# Patient Record
Sex: Male | Born: 1985 | Race: Black or African American | Hispanic: No | Marital: Single | Smoking: Never smoker
Health system: Southern US, Community
[De-identification: ages and names within clinical notes are randomized; demographics above are authoritative.]

## PROBLEM LIST (undated history)

## (undated) DIAGNOSIS — R011 Cardiac murmur, unspecified: Secondary | ICD-10-CM

## (undated) DIAGNOSIS — J45909 Unspecified asthma, uncomplicated: Secondary | ICD-10-CM

## (undated) HISTORY — PX: ROTATOR CUFF REPAIR: SHX139

---

## 2007-05-07 ENCOUNTER — Emergency Department (HOSPITAL_COMMUNITY): Admission: EM | Admit: 2007-05-07 | Discharge: 2007-05-07 | Payer: Self-pay | Admitting: Emergency Medicine

## 2007-09-09 ENCOUNTER — Emergency Department (HOSPITAL_COMMUNITY): Admission: EM | Admit: 2007-09-09 | Discharge: 2007-09-09 | Payer: Self-pay | Admitting: Emergency Medicine

## 2009-06-14 ENCOUNTER — Emergency Department (HOSPITAL_COMMUNITY): Admission: EM | Admit: 2009-06-14 | Discharge: 2009-06-14 | Payer: Self-pay | Admitting: Emergency Medicine

## 2009-08-09 ENCOUNTER — Ambulatory Visit (HOSPITAL_COMMUNITY): Admission: RE | Admit: 2009-08-09 | Discharge: 2009-08-09 | Payer: Self-pay | Admitting: Chiropractic Medicine

## 2009-09-22 ENCOUNTER — Encounter: Admission: RE | Admit: 2009-09-22 | Discharge: 2009-09-22 | Payer: Self-pay | Admitting: Orthopedic Surgery

## 2009-10-24 ENCOUNTER — Ambulatory Visit (HOSPITAL_COMMUNITY): Admission: RE | Admit: 2009-10-24 | Discharge: 2009-10-25 | Payer: Self-pay | Admitting: Orthopedic Surgery

## 2009-12-01 ENCOUNTER — Ambulatory Visit (HOSPITAL_COMMUNITY): Admission: RE | Admit: 2009-12-01 | Discharge: 2009-12-02 | Payer: Self-pay | Admitting: Orthopedic Surgery

## 2010-05-20 ENCOUNTER — Encounter: Payer: Self-pay | Admitting: Chiropractic Medicine

## 2010-07-14 LAB — SURGICAL PCR SCREEN: MRSA, PCR: NEGATIVE

## 2010-07-15 LAB — SURGICAL PCR SCREEN: MRSA, PCR: NEGATIVE

## 2013-01-05 ENCOUNTER — Emergency Department: Payer: Self-pay | Admitting: Emergency Medicine

## 2013-02-07 ENCOUNTER — Emergency Department (HOSPITAL_COMMUNITY)
Admission: EM | Admit: 2013-02-07 | Discharge: 2013-02-07 | Discharge status: Against Medical Advice | Payer: Self-pay | Attending: Emergency Medicine | Admitting: Emergency Medicine

## 2013-02-07 ENCOUNTER — Encounter (HOSPITAL_COMMUNITY): Payer: Self-pay | Admitting: Emergency Medicine

## 2013-02-07 DIAGNOSIS — S8990XA Unspecified injury of unspecified lower leg, initial encounter: Secondary | ICD-10-CM | POA: Insufficient documentation

## 2013-02-07 DIAGNOSIS — J45909 Unspecified asthma, uncomplicated: Secondary | ICD-10-CM | POA: Insufficient documentation

## 2013-02-07 DIAGNOSIS — Y939 Activity, unspecified: Secondary | ICD-10-CM | POA: Insufficient documentation

## 2013-02-07 DIAGNOSIS — W172XXA Fall into hole, initial encounter: Secondary | ICD-10-CM | POA: Insufficient documentation

## 2013-02-07 DIAGNOSIS — Y929 Unspecified place or not applicable: Secondary | ICD-10-CM | POA: Insufficient documentation

## 2013-02-07 HISTORY — DX: Unspecified asthma, uncomplicated: J45.909

## 2013-02-07 NOTE — ED Notes (Signed)
PA went to assess pt however pt was no longer in the room.  Pt did not notify staff he was leaving.

## 2013-02-07 NOTE — ED Notes (Signed)
Pt c/o L knee and ankle pain after stepping in pot hole Friday. Swelling noted to L knee. Steady gait

## 2013-02-07 NOTE — ED Notes (Signed)
Pt has still not returned to room.

## 2014-07-21 ENCOUNTER — Observation Stay (HOSPITAL_COMMUNITY)
Admission: EM | Admit: 2014-07-21 | Discharge: 2014-07-22 | Disposition: A | Discharge status: Home / Self Care | Payer: Worker's Compensation | Attending: Orthopedic Surgery | Admitting: Orthopedic Surgery

## 2014-07-21 ENCOUNTER — Encounter (HOSPITAL_COMMUNITY): Payer: Self-pay | Admitting: General Practice

## 2014-07-21 ENCOUNTER — Emergency Department (HOSPITAL_COMMUNITY): Payer: Worker's Compensation | Admitting: Anesthesiology

## 2014-07-21 ENCOUNTER — Encounter (HOSPITAL_COMMUNITY)
Admission: EM | Disposition: A | Discharge status: Home / Self Care | Payer: Self-pay | Source: Home / Self Care | Attending: Emergency Medicine

## 2014-07-21 ENCOUNTER — Emergency Department (HOSPITAL_COMMUNITY): Payer: Worker's Compensation

## 2014-07-21 DIAGNOSIS — W230XXA Caught, crushed, jammed, or pinched between moving objects, initial encounter: Secondary | ICD-10-CM | POA: Insufficient documentation

## 2014-07-21 DIAGNOSIS — J45909 Unspecified asthma, uncomplicated: Secondary | ICD-10-CM | POA: Diagnosis not present

## 2014-07-21 DIAGNOSIS — Y9289 Other specified places as the place of occurrence of the external cause: Secondary | ICD-10-CM | POA: Diagnosis not present

## 2014-07-21 DIAGNOSIS — S91319A Laceration without foreign body, unspecified foot, initial encounter: Secondary | ICD-10-CM | POA: Diagnosis present

## 2014-07-21 DIAGNOSIS — S91301A Unspecified open wound, right foot, initial encounter: Principal | ICD-10-CM | POA: Insufficient documentation

## 2014-07-21 HISTORY — DX: Cardiac murmur, unspecified: R01.1

## 2014-07-21 HISTORY — PX: I&D EXTREMITY: SHX5045

## 2014-07-21 LAB — RAPID URINE DRUG SCREEN, HOSP PERFORMED
Amphetamines: NOT DETECTED
Barbiturates: NOT DETECTED
Benzodiazepines: NOT DETECTED
Cocaine: NOT DETECTED
Opiates: NOT DETECTED
Tetrahydrocannabinol: POSITIVE — AB

## 2014-07-21 SURGERY — IRRIGATION AND DEBRIDEMENT EXTREMITY
Anesthesia: General | Site: Foot | Laterality: Right

## 2014-07-21 MED ORDER — KETOROLAC TROMETHAMINE 15 MG/ML IJ SOLN
15.0000 mg | Freq: Four times a day (QID) | INTRAMUSCULAR | Status: DC
  Administered 2014-07-22 (×2): 15 mg via INTRAVENOUS
  Filled 2014-07-21 (×2): qty 1

## 2014-07-21 MED ORDER — KETOROLAC TROMETHAMINE 30 MG/ML IJ SOLN
INTRAMUSCULAR | Status: AC
  Filled 2014-07-21: qty 1

## 2014-07-21 MED ORDER — FENTANYL CITRATE 0.05 MG/ML IJ SOLN
50.0000 ug | Freq: Once | INTRAMUSCULAR | Status: AC
  Administered 2014-07-21: 50 ug via INTRAVENOUS
  Filled 2014-07-21: qty 2

## 2014-07-21 MED ORDER — LIDOCAINE HCL 2 % IJ SOLN
15.0000 mL | Freq: Once | INTRAMUSCULAR | Status: DC
  Filled 2014-07-21 (×2): qty 20

## 2014-07-21 MED ORDER — SODIUM CHLORIDE 0.9 % IR SOLN
Status: DC | PRN
  Administered 2014-07-21: 3000 mL

## 2014-07-21 MED ORDER — HYDROCODONE-ACETAMINOPHEN 5-325 MG PO TABS
1.0000 | ORAL_TABLET | ORAL | Status: DC | PRN
  Administered 2014-07-21 – 2014-07-22 (×4): 2 via ORAL
  Filled 2014-07-21 (×4): qty 2

## 2014-07-21 MED ORDER — LIDOCAINE HCL (CARDIAC) 20 MG/ML IV SOLN
INTRAVENOUS | Status: AC
  Filled 2014-07-21: qty 5

## 2014-07-21 MED ORDER — MIDAZOLAM HCL 2 MG/2ML IJ SOLN
INTRAMUSCULAR | Status: AC
  Filled 2014-07-21: qty 2

## 2014-07-21 MED ORDER — PHENYLEPHRINE HCL 10 MG/ML IJ SOLN
INTRAMUSCULAR | Status: DC | PRN
  Administered 2014-07-21: 80 ug via INTRAVENOUS

## 2014-07-21 MED ORDER — BUPIVACAINE HCL 0.25 % IJ SOLN: 15.0000 mL | Freq: Once | INTRAMUSCULAR | Status: DC

## 2014-07-21 MED ORDER — SENNA 8.6 MG PO TABS
1.0000 | ORAL_TABLET | Freq: Two times a day (BID) | ORAL | Status: DC
  Administered 2014-07-21 – 2014-07-22 (×2): 8.6 mg via ORAL
  Filled 2014-07-21 (×2): qty 1

## 2014-07-21 MED ORDER — FENTANYL CITRATE 0.05 MG/ML IJ SOLN
INTRAMUSCULAR | Status: DC | PRN
  Administered 2014-07-21: 100 ug via INTRAVENOUS
  Administered 2014-07-21: 50 ug via INTRAVENOUS

## 2014-07-21 MED ORDER — PROPOFOL 10 MG/ML IV BOLUS
INTRAVENOUS | Status: AC
  Filled 2014-07-21: qty 20

## 2014-07-21 MED ORDER — TETANUS-DIPHTH-ACELL PERTUSSIS 5-2.5-18.5 LF-MCG/0.5 IM SUSP
0.5000 mL | Freq: Once | INTRAMUSCULAR | Status: AC
Start: 2014-07-21 — End: 2014-07-21
  Administered 2014-07-21: 0.5 mL via INTRAMUSCULAR
  Filled 2014-07-21: qty 0.5

## 2014-07-21 MED ORDER — OXYCODONE-ACETAMINOPHEN 5-325 MG PO TABS: 2.0000 | ORAL_TABLET | Freq: Once | ORAL | Status: DC

## 2014-07-21 MED ORDER — MORPHINE SULFATE 2 MG/ML IJ SOLN
2.0000 mg | INTRAMUSCULAR | Status: DC | PRN
  Administered 2014-07-21 – 2014-07-22 (×5): 2 mg via INTRAVENOUS
  Filled 2014-07-21 (×5): qty 1

## 2014-07-21 MED ORDER — FENTANYL CITRATE 0.05 MG/ML IJ SOLN
25.0000 ug | INTRAMUSCULAR | Status: DC | PRN
  Administered 2014-07-21: 50 ug via INTRAVENOUS

## 2014-07-21 MED ORDER — ONDANSETRON HCL 4 MG PO TABS: 4.0000 mg | ORAL_TABLET | Freq: Four times a day (QID) | ORAL | Status: DC | PRN

## 2014-07-21 MED ORDER — CEFAZOLIN SODIUM-DEXTROSE 2-3 GM-% IV SOLR
2.0000 g | Freq: Four times a day (QID) | INTRAVENOUS | Status: AC
  Administered 2014-07-21 – 2014-07-22 (×3): 2 g via INTRAVENOUS
  Filled 2014-07-21 (×3): qty 50

## 2014-07-21 MED ORDER — PROPOFOL 10 MG/ML IV BOLUS
INTRAVENOUS | Status: DC | PRN
  Administered 2014-07-21: 200 mg via INTRAVENOUS

## 2014-07-21 MED ORDER — KETOROLAC TROMETHAMINE 30 MG/ML IJ SOLN
30.0000 mg | Freq: Once | INTRAMUSCULAR | Status: AC | PRN
  Administered 2014-07-21: 30 mg via INTRAVENOUS

## 2014-07-21 MED ORDER — LIDOCAINE HCL (CARDIAC) 20 MG/ML IV SOLN
INTRAVENOUS | Status: DC | PRN
  Administered 2014-07-21: 80 mg via INTRAVENOUS

## 2014-07-21 MED ORDER — CEFAZOLIN SODIUM-DEXTROSE 2-3 GM-% IV SOLR
INTRAVENOUS | Status: DC | PRN
  Administered 2014-07-21: 2 g via INTRAVENOUS

## 2014-07-21 MED ORDER — DOCUSATE SODIUM 100 MG PO CAPS
100.0000 mg | ORAL_CAPSULE | Freq: Two times a day (BID) | ORAL | Status: DC
  Administered 2014-07-21 – 2014-07-22 (×2): 100 mg via ORAL
  Filled 2014-07-21 (×2): qty 1

## 2014-07-21 MED ORDER — LIDOCAINE-EPINEPHRINE 1 %-1:100000 IJ SOLN
20.0000 mL | Freq: Once | INTRAMUSCULAR | Status: AC
  Administered 2014-07-21: 20 mL
  Filled 2014-07-21 (×2): qty 1

## 2014-07-21 MED ORDER — BUPIVACAINE HCL 0.25 % IJ SOLN
15.0000 mL | Freq: Once | INTRAMUSCULAR | Status: DC
  Filled 2014-07-21: qty 15

## 2014-07-21 MED ORDER — SODIUM CHLORIDE 0.9 % IR SOLN
Status: DC | PRN
  Administered 2014-07-21: 1000 mL

## 2014-07-21 MED ORDER — BUPIVACAINE HCL (PF) 0.25 % IJ SOLN
15.0000 mL | Freq: Once | INTRAMUSCULAR | Status: DC
  Filled 2014-07-21: qty 20

## 2014-07-21 MED ORDER — LACTATED RINGERS IV SOLN
INTRAVENOUS | Status: DC
  Administered 2014-07-21: 18:00:00 via INTRAVENOUS

## 2014-07-21 MED ORDER — SUCCINYLCHOLINE CHLORIDE 20 MG/ML IJ SOLN
INTRAMUSCULAR | Status: DC | PRN
  Administered 2014-07-21: 140 mg via INTRAVENOUS

## 2014-07-21 MED ORDER — PROMETHAZINE HCL 25 MG/ML IJ SOLN: 6.2500 mg | INTRAMUSCULAR | Status: DC | PRN

## 2014-07-21 MED ORDER — ONDANSETRON HCL 4 MG/2ML IJ SOLN: 4.0000 mg | Freq: Four times a day (QID) | INTRAMUSCULAR | Status: DC | PRN

## 2014-07-21 MED ORDER — METOCLOPRAMIDE HCL 5 MG/ML IJ SOLN: 5.0000 mg | Freq: Three times a day (TID) | INTRAMUSCULAR | Status: DC | PRN

## 2014-07-21 MED ORDER — FENTANYL CITRATE 0.05 MG/ML IJ SOLN
INTRAMUSCULAR | Status: AC
  Filled 2014-07-21: qty 5

## 2014-07-21 MED ORDER — ACETAMINOPHEN 325 MG PO TABS: 650.0000 mg | ORAL_TABLET | Freq: Four times a day (QID) | ORAL | Status: DC | PRN

## 2014-07-21 MED ORDER — SODIUM CHLORIDE 0.9 % IV SOLN: INTRAVENOUS | Status: DC

## 2014-07-21 MED ORDER — CEFAZOLIN SODIUM 1-5 GM-% IV SOLN
1.0000 g | Freq: Once | INTRAVENOUS | Status: AC
  Administered 2014-07-21: 1 g via INTRAVENOUS
  Filled 2014-07-21: qty 50

## 2014-07-21 MED ORDER — MIDAZOLAM HCL 5 MG/5ML IJ SOLN
INTRAMUSCULAR | Status: DC | PRN
Start: 2014-07-21 — End: 2014-07-21
  Administered 2014-07-21: 2 mg via INTRAVENOUS

## 2014-07-21 MED ORDER — FENTANYL CITRATE 0.05 MG/ML IJ SOLN
INTRAMUSCULAR | Status: AC
  Filled 2014-07-21: qty 2

## 2014-07-21 MED ORDER — ONDANSETRON HCL 4 MG/2ML IJ SOLN
INTRAMUSCULAR | Status: DC | PRN
  Administered 2014-07-21: 4 mg via INTRAVENOUS

## 2014-07-21 MED ORDER — METOCLOPRAMIDE HCL 5 MG PO TABS: 5.0000 mg | ORAL_TABLET | Freq: Three times a day (TID) | ORAL | Status: DC | PRN

## 2014-07-21 MED ORDER — ACETAMINOPHEN 650 MG RE SUPP: 650.0000 mg | Freq: Four times a day (QID) | RECTAL | Status: DC | PRN

## 2014-07-21 SURGICAL SUPPLY — 74 items
BANDAGE ELASTIC 3 VELCRO ST LF (GAUZE/BANDAGES/DRESSINGS) IMPLANT
BANDAGE ELASTIC 4 VELCRO ST LF (GAUZE/BANDAGES/DRESSINGS) ×3 IMPLANT
BANDAGE ELASTIC 6 VELCRO ST LF (GAUZE/BANDAGES/DRESSINGS) ×3 IMPLANT
BLADE SURG 10 STRL SS (BLADE) ×3 IMPLANT
BNDG COHESIVE 1X5 TAN STRL LF (GAUZE/BANDAGES/DRESSINGS) IMPLANT
BNDG COHESIVE 4X5 TAN STRL (GAUZE/BANDAGES/DRESSINGS) ×3 IMPLANT
BNDG COHESIVE 6X5 TAN STRL LF (GAUZE/BANDAGES/DRESSINGS) ×6 IMPLANT
BNDG CONFORM 3 STRL LF (GAUZE/BANDAGES/DRESSINGS) IMPLANT
BNDG GAUZE STRTCH 6 (GAUZE/BANDAGES/DRESSINGS) ×9 IMPLANT
CORDS BIPOLAR (ELECTRODE) IMPLANT
COVER SURGICAL LIGHT HANDLE (MISCELLANEOUS) ×3 IMPLANT
CUFF TOURNIQUET SINGLE 24IN (TOURNIQUET CUFF) IMPLANT
CUFF TOURNIQUET SINGLE 34IN LL (TOURNIQUET CUFF) ×6 IMPLANT
CUFF TOURNIQUET SINGLE 44IN (TOURNIQUET CUFF) IMPLANT
DRAPE EXTREMITY BILATERAL (DRAPE) IMPLANT
DRAPE IMP U-DRAPE 54X76 (DRAPES) IMPLANT
DRAPE INCISE IOBAN 66X45 STRL (DRAPES) ×12 IMPLANT
DRAPE SURG 17X23 STRL (DRAPES) IMPLANT
DRAPE U-SHAPE 47X51 STRL (DRAPES) ×3 IMPLANT
DRSG PAD ABDOMINAL 8X10 ST (GAUZE/BANDAGES/DRESSINGS) ×3 IMPLANT
DURAPREP 26ML APPLICATOR (WOUND CARE) ×3 IMPLANT
ELECT CAUTERY BLADE 6.4 (BLADE) ×3 IMPLANT
ELECT REM PT RETURN 9FT ADLT (ELECTROSURGICAL)
ELECTRODE REM PT RTRN 9FT ADLT (ELECTROSURGICAL) IMPLANT
FACESHIELD WRAPAROUND (MASK) IMPLANT
GAUZE SPONGE 4X4 12PLY STRL (GAUZE/BANDAGES/DRESSINGS) ×6 IMPLANT
GAUZE XEROFORM 1X8 LF (GAUZE/BANDAGES/DRESSINGS) ×3 IMPLANT
GAUZE XEROFORM 5X9 LF (GAUZE/BANDAGES/DRESSINGS) ×3 IMPLANT
GLOVE BIOGEL PI IND STRL 6.5 (GLOVE) ×1 IMPLANT
GLOVE BIOGEL PI IND STRL 8.5 (GLOVE) ×1 IMPLANT
GLOVE BIOGEL PI INDICATOR 6.5 (GLOVE) ×2
GLOVE BIOGEL PI INDICATOR 8.5 (GLOVE) ×2
GLOVE NEODERM STRL 7.5 LF PF (GLOVE) ×2 IMPLANT
GLOVE SURG NEODERM 7.5  LF PF (GLOVE) ×4
GOWN STRL REIN XL XLG (GOWN DISPOSABLE) ×6 IMPLANT
HANDPIECE INTERPULSE COAX TIP (DISPOSABLE)
KIT BASIN OR (CUSTOM PROCEDURE TRAY) ×3 IMPLANT
KIT ROOM TURNOVER OR (KITS) ×3 IMPLANT
MANIFOLD NEPTUNE II (INSTRUMENTS) ×3 IMPLANT
NEEDLE 22X1 1/2 (OR ONLY) (NEEDLE) ×3 IMPLANT
NS IRRIG 1000ML POUR BTL (IV SOLUTION) ×6 IMPLANT
PACK ORTHO EXTREMITY (CUSTOM PROCEDURE TRAY) ×3 IMPLANT
PAD ABD 8X10 STRL (GAUZE/BANDAGES/DRESSINGS) ×3 IMPLANT
PAD ARMBOARD 7.5X6 YLW CONV (MISCELLANEOUS) ×6 IMPLANT
PAD CAST 4YDX4 CTTN HI CHSV (CAST SUPPLIES) ×1 IMPLANT
PADDING CAST ABS 4INX4YD NS (CAST SUPPLIES) ×4
PADDING CAST ABS COTTON 4X4 ST (CAST SUPPLIES) ×2 IMPLANT
PADDING CAST COTTON 4X4 STRL (CAST SUPPLIES) ×2
PADDING CAST COTTON 6X4 STRL (CAST SUPPLIES) ×3 IMPLANT
POSITIONER HEAD PRONE TRACH (MISCELLANEOUS) ×3 IMPLANT
SET CYSTO W/LG BORE CLAMP LF (SET/KITS/TRAYS/PACK) ×3 IMPLANT
SET HNDPC FAN SPRY TIP SCT (DISPOSABLE) IMPLANT
SPLINT PLASTER CAST XFAST 5X30 (CAST SUPPLIES) ×1 IMPLANT
SPLINT PLASTER XFAST SET 5X30 (CAST SUPPLIES) ×2
SPONGE GAUZE 4X4 12PLY STER LF (GAUZE/BANDAGES/DRESSINGS) ×3 IMPLANT
SPONGE LAP 18X18 X RAY DECT (DISPOSABLE) ×6 IMPLANT
STOCKINETTE IMPERVIOUS 9X36 MD (GAUZE/BANDAGES/DRESSINGS) ×3 IMPLANT
SUT ETHILON 2 0 FS 18 (SUTURE) ×9 IMPLANT
SUT ETHILON 2 0 PSLX (SUTURE) ×3 IMPLANT
SUT ETHILON 3 0 PS 1 (SUTURE) ×6 IMPLANT
SUT ETHILON 4 0 PS 2 18 (SUTURE) ×15 IMPLANT
SUT VIC AB 2-0 CT1 36 (SUTURE) ×3 IMPLANT
SUT VIC AB 2-0 FS1 27 (SUTURE) ×6 IMPLANT
SYR CONTROL 10ML LL (SYRINGE) IMPLANT
TOWEL OR 17X24 6PK STRL BLUE (TOWEL DISPOSABLE) ×3 IMPLANT
TOWEL OR 17X26 10 PK STRL BLUE (TOWEL DISPOSABLE) ×3 IMPLANT
TUBE ANAEROBIC SPECIMEN COL (MISCELLANEOUS) IMPLANT
TUBE CONNECTING 12'X1/4 (SUCTIONS) ×1
TUBE CONNECTING 12X1/4 (SUCTIONS) ×2 IMPLANT
TUBE FEEDING 5FR 15 INCH (TUBING) IMPLANT
TUBING CYSTO DISP (UROLOGICAL SUPPLIES) ×3 IMPLANT
UNDERPAD 30X30 INCONTINENT (UNDERPADS AND DIAPERS) ×6 IMPLANT
WATER STERILE IRR 1000ML POUR (IV SOLUTION) ×3 IMPLANT
YANKAUER SUCT BULB TIP NO VENT (SUCTIONS) ×3 IMPLANT

## 2014-07-21 NOTE — ED Provider Notes (Signed)
CSN: [id number]     Arrival date & time 07/21/14  1033 History   First MD Initiated Contact with Patient 07/21/14 1039     Chief Complaint  Patient presents with  . Foot Injury    HPI   29 year old male presents with avulsion injury to his right calcaneus. Patient reports that he was at work when his foot got stuck between a cart and a rail. He reports immediate pain difficulty with ambulation. Patient reports full range of motion of his ankle with pain to the calcaneus. Denies loss of distal sensation, motor function, perfusion. No other injuries noted, no knee pain, patient reports no prior injuries to that extremity. He has no other complaints in addition to the pain.   Past Medical History  Diagnosis Date  . Asthma   . Heart murmur    Past Surgical History  Procedure Laterality Date  . Rotator cuff repair     No family history on file. History  Substance Use Topics  . Smoking status: Never Smoker   . Smokeless tobacco: Not on file  . Alcohol Use: Yes     Comment: socially    Review of Systems  All other systems reviewed and are negative.  Allergies  Review of patient's allergies indicates no known allergies.  Home Medications   Prior to Admission medications   Medication Sig Start Date End Date Taking? Authorizing Provider  ibuprofen (ADVIL,MOTRIN) 200 MG tablet Take 800 mg by mouth every 6 (six) hours as needed for pain.    Historical Provider, MD  naproxen sodium (ANAPROX) 220 MG tablet Take 220 mg by mouth 2 (two) times daily with a meal.    Historical Provider, MD   BP 120/63 mmHg  Pulse 59  Temp(Src) 97.6 F (36.4 C) (Oral)  Resp 12  Ht 5\' 11"  (1.803 m)  Wt 205 lb (92.987 kg)  BMI 28.60 kg/m2  SpO2 100% Physical Exam  Constitutional: He is oriented to person, place, and time. He appears well-developed and well-nourished.  HENT:  Head: Normocephalic and atraumatic.  Eyes: Pupils are equal, round, and reactive to light.  Neck: Normal range of motion.  Neck supple. No JVD present. No tracheal deviation present. No thyromegaly present.  Cardiovascular: Normal rate, regular rhythm, normal heart sounds and intact distal pulses.  Exam reveals no gallop and no friction rub.   No murmur heard. Pulmonary/Chest: Effort normal and breath sounds normal. No stridor. No respiratory distress. He has no wheezes. He has no rales. He exhibits no tenderness.  Musculoskeletal: Normal range of motion.  Elliptical avulsion injury to the right calcaneus approximately 5 cm long by 3 cm, periosteum visualized no foreign bodies. Distal sensation and strength or motor function intact. No sign of tendon injury.  Lymphadenopathy:    He has no cervical adenopathy.  Neurological: He is alert and oriented to person, place, and time. Coordination normal.  Skin: Skin is warm and dry.  Psychiatric: He has a normal mood and affect. His behavior is normal. Judgment and thought content normal.  Nursing note and vitals reviewed.   ED Course  Procedures (including critical care time) Labs Review Labs Reviewed - No data to display  Imaging Review No results found.   EKG Interpretation None     MDM   Final diagnoses:  Avulsion of skin of foot, right, initial encounter    Patient's x-ray showed no signs of fracture. Due to exposed wound patient received 1 g Ancef, and was given multiple doses of fentanyl.  The wound was irrigated with normal saline and covered with a wet-to-dry dressing. Orthopedics, plastics, surgery was consulted.  Brain Swinteck M.D. with [facility] was consult at and agreed to take care for patient. Patient was transferred to the operating room. Stable throughout stay. Patient did not receive tetanus, operating room staff was notified and assured that he'll be receiving it.  Additionally patient's employer called requiring drug screening, [employer] Holstein was the representative.    Eyvonne Mechanic, PA-C 07/21/14 1745  Rolan Bucco,  MD 07/22/14 [phone]

## 2014-07-21 NOTE — Anesthesia Postprocedure Evaluation (Signed)
  Anesthesia Post-op Note  Patient: Cristian Castillo  Procedure(s) Performed: Procedure(s): IRRIGATION AND DEBRIDEMENT SOFT TISSUE AVULSION RIGHT HEEL (Right)  Patient Location: PACU  Anesthesia Type:General  Level of Consciousness: awake, alert , oriented and patient cooperative  Airway and Oxygen Therapy: Patient Spontanous Breathing and Patient connected to nasal cannula oxygen  Post-op Pain: none  Post-op Assessment: Post-op Vital signs reviewed, Patient's Cardiovascular Status Stable, Respiratory Function Stable, Patent Airway, No signs of Nausea or vomiting and Pain level controlled  Post-op Vital Signs: Reviewed and stable  Last Vitals:  Filed Vitals:   07/21/14 2041  BP: 135/63  Pulse: 51  Temp:   Resp: 13    Complications: No apparent anesthesia complications

## 2014-07-21 NOTE — ED Notes (Signed)
Pt brought in via [facility] from working on a [employer]. Pts right foot got stuck in between a trolley and a cart. Pt has a full thickness avulsion on heal. Pt rating pain a 10/10. EMS gave pt 150mcg of fentanyl. EMS V/S 126/78, HR 68, RR 16, SPO2 100%.

## 2014-07-21 NOTE — Anesthesia Procedure Notes (Addendum)
Procedure Name: LMA Insertion Date/Time: 07/21/2014 6:02 PM Performed by: Adonis HousekeeperNGELL, Lily Velasquez M Pre-anesthesia Checklist: Patient identified, Emergency Drugs available, Suction available and Patient being monitored Patient Re-evaluated:Patient Re-evaluated prior to inductionOxygen Delivery Method: Circle system utilized Preoxygenation: Pre-oxygenation with 100% oxygen Intubation Type: IV induction Ventilation: Mask ventilation without difficulty LMA: LMA inserted LMA Size: 5.0 Number of attempts: 1 Placement Confirmation: positive ETCO2 and breath sounds checked- equal and bilateral Tube secured with: Tape   Procedure Name: Intubation Date/Time: 07/21/2014 6:16 PM Performed by: Adonis HousekeeperNGELL, Beatrice Ziehm M Pre-anesthesia Checklist: Patient identified, Suction available, Emergency Drugs available and Patient being monitored Patient Re-evaluated:Patient Re-evaluated prior to inductionPreoxygenation: preoxygenated, pt already had inhaled anesthetics on board. Laryngoscope Size: Mac and 4 Grade View: Grade II Tube type: Oral Tube size: 7.5 mm Number of attempts: 1 Airway Equipment and Method: Stylet Placement Confirmation: ETT inserted through vocal cords under direct vision,  positive ETCO2 and breath sounds checked- equal and bilateral Secured at: 22 cm Tube secured with: Tape Dental Injury: Teeth and Oropharynx as per pre-operative assessment

## 2014-07-21 NOTE — Transfer of Care (Signed)
Immediate Anesthesia Transfer of Care Note 2 Patient: Cristian Castillo  Procedure(s) Performed: Procedure(s): IRRIGATION AND DEBRIDEMENT SOFT TISSUE AVULSION RIGHT HEEL (Right)  Patient Location: PACU  Anesthesia Type:General  Level of Consciousness: awake, alert , oriented and patient cooperative  Airway & Oxygen Therapy: Patient Spontanous Breathing and Patient connected to nasal cannula oxygen  Post-op Assessment: Report given to RN, Post -op Vital signs reviewed and stable, Patient moving all extremities and Patient moving all extremities X 4  Post vital signs: Reviewed and stable  Last Vitals:  Filed Vitals:   07/21/14 2041  BP: 135/63  Pulse: 51  Temp:   Resp: 13    Complications: No apparent anesthesia complications

## 2014-07-21 NOTE — Consult Note (Signed)
   ORTHOPAEDIC CONSULTATION  REQUESTING PHYSICIAN: No att. providers found Eyvonne MechanicJeffrey Hedges  PCP:  No PCP Per Patient  Chief Complaint: right foot injury  HPI: Cristian Castillo is a 29 y.o. male who complains of  Right foot injury. Was at work earlier today when his right foot got caught between a metal cart and a trolley. Has soft tissue avulsion over heel. Received tetanus. Denies other injuries.  Past Medical History  Diagnosis Date  . Asthma   . Heart murmur    Past Surgical History  Procedure Laterality Date  . Rotator cuff repair     History   Social History  . Marital Status: Single    Spouse Name: N/A  . Number of Children: N/A  . Years of Education: N/A   Social History Main Topics  . Smoking status: Never Smoker   . Smokeless tobacco: Not on file  . Alcohol Use: Yes     Comment: socially  . Drug Use: No  . Sexual Activity: Not on file   Other Topics Concern  . None   Social History Narrative   History reviewed. No pertinent family history. No Known Allergies Prior to Admission medications   Medication Sig Start Date End Date Taking? Authorizing Provider  ibuprofen (ADVIL,MOTRIN) 200 MG tablet Take 800 mg by mouth every 6 (six) hours as needed for headache or moderate pain.    Yes Historical Provider, MD  naproxen sodium (ANAPROX) 220 MG tablet Take 220 mg by mouth 2 (two) times daily with a meal.   Yes Historical Provider, MD  PRESCRIPTION MEDICATION Take 1 tablet by mouth daily as needed (muscle relaxer).   Yes Historical Provider, MD   Dg Foot Complete Right  07/21/2014   CLINICAL DATA:  29 year old male with crush injury to right foot. Heel avulsion. Initial encounter.  EXAM: RIGHT FOOT COMPLETE - 3+ VIEW  COMPARISON:  None.  FINDINGS: Severe soft tissue injury over the dorsal calcaneus, appears to extend to the periosteum. The calcaneus appears intact. Alignment about the right ankle appears preserved. Midfoot and distal foot osseous structures appear  intact and normally aligned. Healed appearing chronic fractures at the head of the third metatarsal and base of the fourth proximal phalanx. No acute fracture identified.  IMPRESSION: Severe soft tissue wound at the dorsal heel appears to extend to the surface of the calcaneus.  No calcaneal fracture, and no other acute fracture or dislocation identified in the right foot.   Electronically Signed   By: Odessa FlemingH  Hall M.D.   On: 07/21/2014 11:44    Positive ROS: All other systems have been reviewed and were otherwise negative with the exception of those mentioned in the HPI and as above.  Physical Exam: General: Alert, no acute distress Cardiovascular: No pedal edema Respiratory: No cyanosis, no use of accessory musculature GI: No organomegaly, abdomen is soft and non-tender Skin: No lesions in the area of chief complaint Neurologic: Sensation intact distally Psychiatric: Patient is competent for consent with normal mood and affect Lymphatic: No axillary or cervical lymphadenopathy  MUSCULOSKELETAL: R foot bandaged. 2+ DP. Wiggles toes. + TA/Gs/EHL. Normal thompson test.  Assessment: Soft tissue avulsion, complex wound, right foot  Plan: I discussed the situation with the patient and his mother. Needs operative debridement and closure. May not be able to close wound. May need wound VAC and additional coverage procedures. At risk for infection. They understand.   Rodriguez Aguinaldo, Cloyde ReamsBrian James, MD Cell [phone]    07/21/2014 5:56 PM

## 2014-07-21 NOTE — Brief Op Note (Signed)
07/21/2014  7:43 PM  PATIENT:  Estella Huskodney Stahnke  29 y.o. male  PRE-OPERATIVE DIAGNOSIS:  Soft tissue Avulsion to right heel  POST-OPERATIVE DIAGNOSIS:  Soft tissue Avulsion to right heel  PROCEDURE:  Procedure(s): IRRIGATION AND DEBRIDEMENT SOFT TISSUE AVULSION RIGHT HEEL (Right)  SURGEON:  Surgeon(s) and Role:    * Samson FredericBrian Ryan Ogborn, MD - Primary  PHYSICIAN ASSISTANT:   ASSISTANTS: none   ANESTHESIA:   general  EBL:     BLOOD ADMINISTERED:none  DRAINS: none   LOCAL MEDICATIONS USED:  NONE  SPECIMEN:  No Specimen  DISPOSITION OF SPECIMEN:  N/A  COUNTS:  YES  TOURNIQUET:    DICTATION: .Other Dictation: Dictation Number [id number]  PLAN OF CARE: Admit for overnight observation  PATIENT DISPOSITION:  PACU - hemodynamically stable.   Delay start of Pharmacological VTE agent (>24hrs) due to surgical blood loss or risk of bleeding: not applicable

## 2014-07-21 NOTE — Anesthesia Preprocedure Evaluation (Addendum)
Anesthesia Evaluation  Patient identified by MRN, date of birth, ID band Patient awake    Reviewed: Allergy & Precautions, NPO status , Patient's Chart, lab work & pertinent test results  Airway Mallampati: II  TM Distance: >3 FB Neck ROM: Full    Dental no notable dental hx.    Pulmonary asthma ,  breath sounds clear to auscultation  Pulmonary exam normal       Cardiovascular negative cardio ROS  Rhythm:Regular Rate:Normal     Neuro/Psych negative neurological ROS  negative psych ROS   GI/Hepatic negative GI ROS, Neg liver ROS,   Endo/Other  negative endocrine ROS  Renal/GU negative Renal ROS  negative genitourinary   Musculoskeletal negative musculoskeletal ROS (+)   Abdominal   Peds negative pediatric ROS (+)  Hematology negative hematology ROS (+)   Anesthesia Other Findings   Reproductive/Obstetrics negative OB ROS                             Anesthesia Physical Anesthesia Plan  ASA: II  Anesthesia Plan: General   Post-op Pain Management:    Induction: Intravenous  Airway Management Planned: LMA and Oral ETT  Additional Equipment:   Intra-op Plan:   Post-operative Plan: Extubation in OR  Informed Consent: I have reviewed the patients History and Physical, chart, labs and discussed the procedure including the risks, benefits and alternatives for the proposed anesthesia with the patient or authorized representative who has indicated his/her understanding and acceptance.   Dental advisory given  Plan Discussed with: CRNA and Surgeon  Anesthesia Plan Comments: (Npo overnight)       Anesthesia Quick Evaluation

## 2014-07-21 NOTE — ED Notes (Signed)
Wet to dry dressing applied to foot.

## 2014-07-22 MED ORDER — DOCUSATE SODIUM 100 MG PO CAPS: 100.0000 mg | ORAL_CAPSULE | Freq: Two times a day (BID) | ORAL | Status: DC

## 2014-07-22 MED ORDER — HYDROCODONE-ACETAMINOPHEN 5-325 MG PO TABS: 1.0000 | ORAL_TABLET | ORAL | Status: AC | PRN

## 2014-07-22 MED ORDER — AMOXICILLIN-POT CLAVULANATE 875-125 MG PO TABS: 1.0000 | ORAL_TABLET | Freq: Two times a day (BID) | ORAL | Status: AC

## 2014-07-22 NOTE — Progress Notes (Signed)
Orthopedic Tech Progress Note Patient Details:  Cristian HuskRodney Castillo 04/09/1986 [id number]  Patient ID: Cristian Castillo, male   DOB: 12/20/1985, 29 y.o.   MRN: [id number] Viewed order from doctor's order list  [name], [name] 07/22/2014, 2:27 PM

## 2014-07-22 NOTE — Op Note (Signed)
Cristian Castillo, KOMAN NO.:  [id number]  MEDICAL RECORD NO.:  [id number]  LOCATION:  [id number]                        FACILITY:  [facility]  PHYSICIAN:  [name], MD     DATE OF BIRTH:  01/01/1986  DATE OF PROCEDURE:  07/21/2014 DATE OF DISCHARGE:  07/21/2014                              OPERATIVE REPORT   SURGEON:  [name], MD  ASSISTANT:  None.  PREOPERATIVE DIAGNOSIS:  Soft tissue avulsion/complex laceration to right heel.  PROCEDURE:  Debridement of skin, subcutaneous tissue, and fascia (paratenon) of the right heel wound with primary closure of complex laceration, totaling 15 cm in length.  IMPLANTS:  None.  SPECIMENS:  None.  TUBES AND DRAINS:  None.  DISPOSITION:  Stable to PACU.  COMPLICATIONS:  None.  ANTIBIOTICS:  Ancef 2 g.  INDICATIONS:  The patient is a 29 year old male who was starting a new job today.  He got his right lower extremity caught between a trolley and a metal plate essentially which caught him on the back of the right heel.  He had immediate pain and bleeding.  He had a large laceration. He was brought to the emergency room where x-rays revealed no fracture or dislocation over the calcaneus.  He was given IV Ancef and tetanus immunization.  Orthopedic consultation was obtained.  Risks, benefits, and alternatives to debridement and wound closure were explained, and the patient elected to proceed.  He understands that he may require additional surgeries in the future.  He understands that his skin flap may die, requiring soft tissue coverage or flap in the future.  He wished to proceed.  DESCRIPTION OF PROCEDURE IN DETAIL:  I identified the patient in the holding area.  Surgical site was marked.  He was taken to the operating room and general anesthesia was induced.  He was flipped prone.  The right lower extremity was prepped and draped in a normal sterile surgical fashion.  Time-out was called, verifying the sign  and site of surgery.  He did have a nonsterile tourniquet to the right thigh, but we did not inflate it.  I began by examining his right lower extremity.  He had a complex wound over the posterior aspect of the calcaneus near the insertion site of the Achilles on the calcaneus.  He essentially had a large wound that was 4 cm long x 5.5 cm wide.  There were 2 laterally based skin flaps within this wound.  The proximal one measured 2 x 5 and the distal one measured 2 x 4.5.  The distal flap was of good quality. The proximal flap was ecchymotic, but appeared viable.  I began by using a rongeur to sharply debride the subcutaneous tissues of contaminated and devitalized tissue.  I used 15 blade to excisionally debride the jagged and nonviable skin edges.  I then turned my attention deep into the wound.  He did have abrasion over the paratenon.  The fibers of the Achilles tendon were intact.  I did debride the paratenon of any devitalized tissue.  His neurovascular bundle was not involved; this was located well medial to where his wound was.  After meticulous systematic debridement of skin down  to paratenon, I then irrigated with 3 L of saline using cysto tubing.  I then closed the complex laceration using 4- 0 interrupted nylon sutures.  The wound did close with undue tension.  I then applied a dressing with Xeroform, fluff, 4x4, ABDs, and a posterior splint.  Sponge, needle, and instrument counts were correct at the end of the case x2.  There were no complications.  I discussed operative events and findings with the patient's family. They understand that his wound healing is contingent upon him not getting infected and upon his skin flaps remaining viable.  He will be nonweightbearing with crutches.  He will ice and elevate.  We will give him antibiotics overnight and plan to discharge him in the morning.  He will follow up in the office in 2 weeks and will take off his splint and have a  look at the wound.  If the skin flaps are not viable, [facility] will [facility] and have the Plastics take a look at him.          ______________________________ Samson FredericBrian Akeem Heppler, MD     BS/MEDQ  D:  07/21/2014  T:  07/22/2014  Job:  [id number]

## 2014-07-22 NOTE — Discharge Instructions (Signed)
Keep splint clean and dry. Do not remove. Non weight bearing right foot with crutches. Elevate right foot.

## 2014-07-22 NOTE — Progress Notes (Signed)
   Subjective:  Patient reports pain as mild to moderate.  No c/o.  Objective:   VITALS:   Filed Vitals:   07/21/14 2045 07/21/14 2102 07/22/14 0033 07/22/14 0500  BP:  137/58 104/63 108/51  Pulse: 51 52 59 66  Temp: 97.9 F (36.6 C) 97.8 F (36.6 C) 98 F (36.7 C) 98.3 F (36.8 C)  TempSrc:      Resp: 9 14 16 16   Height:      Weight:      SpO2: 100% 100% 97% 100%    Neurovascular intact splint C/D/I.  No results found for: WBC, HGB, HCT, MCV, PLT BMET No results found for: NA, K, CL, CO2, GLUCOSE, BUN, CREATININE, CALCIUM, GFRNONAA, GFRAA   Assessment/Plan: 1 Day Post-Op   Active Problems:   Laceration of heel without complication   Advance diet Up with therapy Discharge home with home health Finish IV abx today, then DC home   Cristian Castillo, Cristian ReamsBrian Castillo 07/22/2014, 9:06 AM   Cristian FredericBrian Kyleena Scheirer, MD Cell [phone]

## 2014-07-22 NOTE — Discharge Summary (Signed)
Physician Discharge Summary  Patient ID: Cristian Castillo MRN: [id number] DOB/AGE: Sep 08, 1985 29 y.o.  Admit date: 07-27-14 Discharge date: 07/22/2014  Admission Diagnoses:  Complex right foot laceration  Discharge Diagnoses:  Complex right foot laceration  Past Medical History  Diagnosis Date  . Asthma   . Heart murmur     Surgeries: Procedure(s): IRRIGATION AND DEBRIDEMENT SOFT TISSUE AVULSION RIGHT HEEL on 07/27/14   Consultants (if any):    Discharged Condition: Improved  Hospital Course: Cristian Castillo is an 29 y.o. male who was admitted 2014-07-27 with a diagnosis of Laceration of heel without complication and went to the operating room on 27-Jul-2014 and underwent the above named procedures.    He was given perioperative antibiotics:  Anti-infectives    Start     Dose/Rate Route Frequency Ordered Stop   07/22/14 0000  ceFAZolin (ANCEF) IVPB 2 g/50 mL premix     2 g 100 mL/hr over 30 Minutes Intravenous Every 6 hours Jul 27, 2014 2117 07/22/14 1759   07/22/14 0000  amoxicillin-clavulanate (AUGMENTIN) 875-125 MG per tablet     1 tablet Oral 2 times daily 07/22/14 0910     07/27/14 1100  ceFAZolin (ANCEF) IVPB 1 g/50 mL premix     1 g 100 mL/hr over 30 Minutes Intravenous  Once 07/27/2014 1053 27-Jul-2014 1200    .  He was given sequential compression devices, early ambulation for DVT prophylaxis.  He benefited maximally from the hospital stay and there were no complications.    Recent vital signs:  Filed Vitals:   07/22/14 0500  BP: 108/51  Pulse: 66  Temp: 98.3 F (36.8 C)  Resp: 16    Recent laboratory studies:  No results found for: HGB No results found for: WBC, PLT No results found for: INR No results found for: NA, K, CL, CO2, BUN, CREATININE, GLUCOSE  Discharge Medications:     Medication List    STOP taking these medications        naproxen sodium 220 MG tablet  Commonly known as:  ANAPROX      TAKE these medications        amoxicillin-clavulanate 875-125 MG per tablet  Commonly known as:  AUGMENTIN  Take 1 tablet by mouth 2 (two) times daily.     docusate sodium 100 MG capsule  Commonly known as:  COLACE  Take 1 capsule (100 mg total) by mouth 2 (two) times daily.     HYDROcodone-acetaminophen 5-325 MG per tablet  Commonly known as:  NORCO/VICODIN  Take 1-2 tablets by mouth every 4 (four) hours as needed (breakthrough pain).     ibuprofen 200 MG tablet  Commonly known as:  ADVIL,MOTRIN  Take 800 mg by mouth every 6 (six) hours as needed for headache or moderate pain.     PRESCRIPTION MEDICATION  Take 1 tablet by mouth daily as needed (muscle relaxer).        Diagnostic Studies: Dg Foot Complete Right  2014-07-27   CLINICAL DATA:  29 year old male with crush injury to right foot. Heel avulsion. Initial encounter.  EXAM: RIGHT FOOT COMPLETE - 3+ VIEW  COMPARISON:  None.  FINDINGS: Severe soft tissue injury over the dorsal calcaneus, appears to extend to the periosteum. The calcaneus appears intact. Alignment about the right ankle appears preserved. Midfoot and distal foot osseous structures appear intact and normally aligned. Healed appearing chronic fractures at the head of the third metatarsal and base of the fourth proximal phalanx. No acute fracture identified.  IMPRESSION: Severe soft tissue  wound at the dorsal heel appears to extend to the surface of the calcaneus.  No calcaneal fracture, and no other acute fracture or dislocation identified in the right foot.   Electronically Signed   By: Odessa FlemingH  Hall M.D.   On: 07/21/2014 11:44    Disposition: 07-Left Against Medical Advice      Discharge Instructions    Call MD / Call 911    Complete by:  As directed   If you experience chest pain or shortness of breath, CALL 911 and be transported to the hospital emergency room.  If you develope a fever above 101 F, pus (white drainage) or increased drainage or redness at the wound, or calf pain, call your surgeon's  office.     Constipation Prevention    Complete by:  As directed   Drink plenty of fluids.  Prune juice may be helpful.  You may use a stool softener, such as Colace (over the counter) 100 mg twice a day.  Use MiraLax (over the counter) for constipation as needed.     Diet - low sodium heart healthy    Complete by:  As directed      Discharge instructions    Complete by:  As directed   Keep splint clean and dry. Do not remove Elevate right foot     Increase activity slowly as tolerated    Complete by:  As directed            Follow-up Information    Follow up with Ellen Mayol, Cloyde ReamsBrian James, MD. Schedule an appointment as soon as possible for a visit in 2 weeks.   Specialty:  Orthopedic Surgery   Why:  For wound re-check   Contact information:   [address] [place] [id number] [phone]        Signed: Garnet KoyanagiSwinteck, Lynann Demetrius James 07/22/2014, 9:12 AM

## 2014-07-22 NOTE — Progress Notes (Signed)
Orthopedic Tech Progress Note Patient Details:  Estella HuskRodney Warsame 10/04/1985 [id number]  Ortho Devices Type of Ortho Device: Crutches Ortho Device/Splint Interventions: Application   Nikki Domrawford, Faizon Capozzi 07/22/2014, 2:27 PM

## 2014-07-22 NOTE — Progress Notes (Signed)
UR completed 

## 2014-07-22 NOTE — Evaluation (Signed)
Physical Therapy Evaluation Patient Details Name: Cristian Castillo MRN: [id number] DOB: [birth year] Today's Date: 07/22/2014   History of Present Illness  Pt is a 29 y/o M s/p I&D soft tissue avulsion R heel on 09/21/14.  Pt's PMH includes heart murmur and asthma.  Clinical Impression  Patient is s/p above procedure resulting in functional limitations due to the deficits listed below (see PT Problem List). Pt demonstrated ability to ambulate using RW 225 ft and crutches 25 ft w/ v/c's to slow down when turning to avoid LOB.  Pt requests crutches to use upon d/c.  Patient will benefit from skilled PT to increase his independence and safety with mobility to allow discharge to the venue listed below.       Follow Up Recommendations Home health PT;Supervision - Intermittent (Pt requests HHPT)    Equipment Recommendations  Crutches    Recommendations for Other Services       Precautions / Restrictions Precautions Precautions: Fall Restrictions Weight Bearing Restrictions: Yes RLE Weight Bearing: Non weight bearing      Mobility  Bed Mobility Overal bed mobility: Modified Independent             General bed mobility comments: use of rails and increased time.   Transfers Overall transfer level: Needs assistance Equipment used: Rolling walker (2 wheeled);Crutches Transfers: Sit to/from Stand Sit to Stand: Min guard         General transfer comment: Pt performs sit<>stand quicly and PT provided v/c's to slow down transition so as to maintain balance  Ambulation/Gait Ambulation/Gait assistance: Min guard Ambulation Distance (Feet): 250 Feet Assistive device: Crutches;Rolling walker (2 wheeled) Gait Pattern/deviations: Antalgic (hop on LLE)   Gait velocity interpretation: Below normal speed for age/gender General Gait Details: v/c's to maintain RW close to body when ambulating.  V/c's to take turns slowly as pt had slight LOB 2/2 turning too quickly w/ RW and crutches.  Pt  prefers crutches aside from the LOB during the turn he had no issues using them.  Stairs            Wheelchair Mobility    Modified Rankin (Stroke Patients Only)       Balance Overall balance assessment: Needs assistance Sitting-balance support: No upper extremity supported;Feet supported (LLE supported) Sitting balance-Leahy Scale: Good     Standing balance support: Bilateral upper extremity supported Standing balance-Leahy Scale: Fair Standing balance comment: Pt moves quickly during transitions/transfers which caused him to loose his balance x1 but pt was able to stabilize himself using his RW.                             Pertinent Vitals/Pain Pain Assessment: 0-10 Pain Score: 8  Pain Location: R ankle Pain Descriptors / Indicators: Aching;Throbbing;Heaviness Pain Intervention(s): Limited activity within patient's tolerance;Monitored during session;Repositioned    Home Living Family/patient expects to be discharged to:: Private residence Living Arrangements: Spouse/significant other (Fiance: Tiffany) Available Help at Discharge: Available PRN/intermittently (Fiance available 24/7 this weekend, then intermittently) Type of Home: Apartment Home Access: Level entry     Home Layout: One level;Able to live on main level with bedroom/bathroom Home Equipment: None      Prior Function Level of Independence: Independent               Hand Dominance   Dominant Hand: Right    Extremity/Trunk Assessment               Lower Extremity  Assessment: RLE deficits/detail RLE Deficits / Details: as expected s/p R heel laceration    Cervical / Trunk Assessment: Normal  Communication   Communication: No difficulties  Cognition Arousal/Alertness: Awake/alert Behavior During Therapy: WFL for tasks assessed/performed Overall Cognitive Status: Within Functional Limits for tasks assessed                      General Comments General  comments (skin integrity, edema, etc.): Pt requires v/c's to slow down when turning and during sit<>stand.      Exercises Total Joint Exercises Ankle Circles/Pumps: AROM;Both;10 reps;Supine Quad Sets: AROM;Right;5 reps;Supine Straight Leg Raises: AROM;Right;10 reps;Supine      Assessment/Plan    PT Assessment Patient needs continued PT services  PT Diagnosis Difficulty walking;Abnormality of gait;Generalized weakness;Acute pain   PT Problem List Decreased strength;Decreased range of motion;Decreased activity tolerance;Decreased balance;Decreased mobility;Decreased coordination;Decreased knowledge of use of DME;Decreased safety awareness;Decreased knowledge of precautions;Pain  PT Treatment Interventions DME instruction;Gait training;Stair training;Functional mobility training;Therapeutic activities;Therapeutic exercise;Balance training;Neuromuscular re-education;Patient/family education;Modalities   PT Goals (Current goals can be found in the Care Plan section) Acute Rehab PT Goals Patient Stated Goal: to go home PT Goal Formulation: With patient Time For Goal Achievement: 08/05/14 Potential to Achieve Goals: Good    Frequency Min 5X/week   Barriers to discharge Decreased caregiver support Fiance available for assist only intermittently    Co-evaluation               End of Session Equipment Utilized During Treatment: Gait belt Activity Tolerance: Patient tolerated treatment well Patient left: in chair;with call bell/phone within reach Nurse Communication: Mobility status;Precautions;Weight bearing status (Pt will need crutches)    Functional Assessment Tool Used: clinical judgement Functional Limitation: Mobility: Walking and moving around Mobility: Walking and Moving Around Current Status ([id number]): At least 20 percent but less than 40 percent impaired, limited or restricted Mobility: Walking and Moving Around Goal Status [phone]): At least 1 percent but less than 20  percent impaired, limited or restricted    Time: 1016-1045 PT Time Calculation (min) (ACUTE ONLY): 29 min   Charges:   PT Evaluation $Initial PT Evaluation Tier I: 1 Procedure PT Treatments $Gait Training: 8-22 mins   PT G Codes:   PT G-Codes **NOT FOR INPATIENT CLASS** Functional Assessment Tool Used: clinical judgement Functional Limitation: Mobility: Walking and moving around Mobility: Walking and Moving Around Current Status ([id number](G8978): At least 20 percent but less than 40 percent impaired, limited or restricted Mobility: Walking and Moving Around Goal Status [phone](G8979): At least 1 percent but less than 20 percent impaired, limited or restricted    Michail JewelsAshley Parr PT, DPT [phone] 07/22/2014, 11:52 AM

## 2014-07-25 ENCOUNTER — Encounter (HOSPITAL_COMMUNITY): Payer: Self-pay | Admitting: Orthopedic Surgery

## 2015-10-16 ENCOUNTER — Encounter: Payer: Self-pay | Admitting: Emergency Medicine

## 2015-10-16 ENCOUNTER — Emergency Department
Admission: EM | Admit: 2015-10-16 | Discharge: 2015-10-16 | Disposition: A | Discharge status: Home / Self Care | Payer: Worker's Compensation | Attending: Emergency Medicine | Admitting: Emergency Medicine

## 2015-10-16 DIAGNOSIS — Y9389 Activity, other specified: Secondary | ICD-10-CM | POA: Diagnosis not present

## 2015-10-16 DIAGNOSIS — J45909 Unspecified asthma, uncomplicated: Secondary | ICD-10-CM | POA: Insufficient documentation

## 2015-10-16 DIAGNOSIS — S0502XA Injury of conjunctiva and corneal abrasion without foreign body, left eye, initial encounter: Secondary | ICD-10-CM | POA: Diagnosis not present

## 2015-10-16 DIAGNOSIS — Y92511 Restaurant or cafe as the place of occurrence of the external cause: Secondary | ICD-10-CM | POA: Diagnosis not present

## 2015-10-16 DIAGNOSIS — Y990 Civilian activity done for income or pay: Secondary | ICD-10-CM | POA: Diagnosis not present

## 2015-10-16 DIAGNOSIS — S0592XA Unspecified injury of left eye and orbit, initial encounter: Secondary | ICD-10-CM | POA: Diagnosis present

## 2015-10-16 DIAGNOSIS — Z79899 Other long term (current) drug therapy: Secondary | ICD-10-CM | POA: Diagnosis not present

## 2015-10-16 DIAGNOSIS — W228XXA Striking against or struck by other objects, initial encounter: Secondary | ICD-10-CM | POA: Diagnosis not present

## 2015-10-16 MED ORDER — GENTAMICIN SULFATE 0.3 % OP SOLN: 1.0000 [drp] | OPHTHALMIC | Status: AC

## 2015-10-16 MED ORDER — ERYTHROMYCIN 5 MG/GM OP OINT
1.0000 "application " | TOPICAL_OINTMENT | Freq: Once | OPHTHALMIC | Status: AC
  Administered 2015-10-16: 1 via OPHTHALMIC
  Filled 2015-10-16: qty 1

## 2015-10-16 MED ORDER — FLUORESCEIN SODIUM 1 MG OP STRP
1.0000 | ORAL_STRIP | Freq: Once | OPHTHALMIC | Status: AC
  Administered 2015-10-16: 1 via OPHTHALMIC
  Filled 2015-10-16: qty 1

## 2015-10-16 MED ORDER — KETOROLAC TROMETHAMINE 0.5 % OP SOLN: 1.0000 [drp] | Freq: Four times a day (QID) | OPHTHALMIC | Status: AC

## 2015-10-16 MED ORDER — TETRACAINE HCL 0.5 % OP SOLN
2.0000 [drp] | Freq: Once | OPHTHALMIC | Status: AC
  Administered 2015-10-16: 2 [drp] via OPHTHALMIC
  Filled 2015-10-16: qty 2

## 2015-10-16 NOTE — ED Notes (Signed)
States he accidentally stuck something in his left eye this am

## 2015-10-16 NOTE — ED Provider Notes (Signed)
[facility] Emergency Department Provider Note ____________________________________________  Time seen: 1618  I have reviewed the triage vital signs and the nursing notes.  HISTORY  Chief Complaint  Eye Pain  HPI Cristian Castillo is a 30 y.o. male to the ED for evaluation of injury sustained to his left eye after a box fell on him at work. 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 He reports a foreign body sensation to the left eye after the corner of the box accidentally hit the eyeball.He complains of FBS and tearing. He rates his discomfort at 10/10 in triage.  Past Medical History  Diagnosis Date  . Asthma   . Heart murmur     Patient Active Problem List   Diagnosis Date Noted  . Laceration of heel without complication 07/21/2014    Past Surgical History  Procedure Laterality Date  . Rotator cuff repair    . I&d extremity Right 07/21/2014    Procedure: IRRIGATION AND DEBRIDEMENT SOFT TISSUE AVULSION RIGHT HEEL;  Surgeon: Samson FredericBrian Swinteck, MD;  Location: [facility] OR;  Service: Orthopedics;  Laterality: Right;    Current Outpatient Rx  Name  Route  Sig  Dispense  Refill  . amoxicillin-clavulanate (AUGMENTIN) 875-125 MG per tablet   Oral   Take 1 tablet by mouth 2 (two) times daily.   10 tablet   0   . docusate sodium (COLACE) 100 MG capsule   Oral   Take 1 capsule (100 mg total) by mouth 2 (two) times daily.   60 capsule   0   . HYDROcodone-acetaminophen (NORCO/VICODIN) 5-325 MG per tablet   Oral   Take 1-2 tablets by mouth every 4 (four) hours as needed (breakthrough pain).   50 tablet   0   . ibuprofen (ADVIL,MOTRIN) 200 MG tablet   Oral   Take 800 mg by mouth every 6 (six) hours as needed for headache or moderate pain.          Marland Kitchen. PRESCRIPTION MEDICATION   Oral   Take 1 tablet by mouth daily as needed (muscle relaxer).          Allergies Review of patient's allergies indicates no known allergies.  No family history on file.  Social  History Social History  Substance Use Topics  . Smoking status: Never Smoker   . Smokeless tobacco: None  . Alcohol Use: Yes     Comment: socially   Review of Systems  Constitutional: Negative for fever. Eyes: Negative for visual changes. Left eye FBS as above.  Gastrointestinal: Negative for abdominal pain, vomiting and diarrhea. Neurological: Negative for headaches, focal weakness or numbness. ____________________________________________  PHYSICAL EXAM:  VITAL SIGNS: ED Triage Vitals  Enc Vitals Group     BP 10/16/15 1514 112/73 mmHg     Pulse Rate 10/16/15 1514 74     Resp 10/16/15 1514 18     Temp 10/16/15 1514 98.1 F (36.7 C)     Temp Source 10/16/15 1514 Oral     SpO2 10/16/15 1514 98 %     Weight 10/16/15 1514 185 lb (83.915 kg)     Height 10/16/15 1514 5\' 11"  (1.803 m)     Head Cir --      Peak Flow --      Pain Score 10/16/15 1514 10     Pain Loc --      Pain Edu? --      Excl. in GC? --    Constitutional: Alert and oriented. Well  appearing and in no distress. Head: Normocephalic and atraumatic. Eyes: Conjunctivae are normal. PERRL. Normal extraocular movements. Left eye with no gross foreign body on inspection. Fluorescein dye uptake at the 11 o'clock position on the cornea.  Respiratory: Normal respiratory effort.  Neurologic:  Normal gait without ataxia. Normal speech and language. No gross focal neurologic deficits are appreciated. Skin:  Skin is warm, dry and intact. No rash noted. ____________________________________________  PROCEDURES  Tetracaine iii gtts OS Erythromycin oph ointment OS ____________________________________________  INITIAL IMPRESSION / ASSESSMENT AND PLAN / ED COURSE  Acute eye pain due to left corneal abrasion. Patient discharged with a prescription for garamycin and Acular. He will follow-up with [facility] as needed. A work note for one day is provided, if needed.   ____________________________________________  FINAL CLINICAL IMPRESSION(S) / ED DIAGNOSES  Final diagnoses:  Corneal abrasion, left, initial encounter     Lissa Hoard, PA-C 10/16/15 1652  Loleta Rose, MD 10/17/15 [phone]

## 2015-10-16 NOTE — ED Notes (Signed)
Pt informed to return if any life threatening symptoms occur.  

## 2015-10-16 NOTE — Discharge Instructions (Signed)
Corneal Abrasion The cornea is the clear covering at the front and center of the eye. When looking at the colored portion of the eye (iris), you are looking through the cornea. This very thin tissue is made up of many layers. The surface layer is a single layer of cells (corneal epithelium) and is one of the most sensitive tissues in the body. If a scratch or injury causes the corneal epithelium to come off, it is called a corneal abrasion. If the injury extends to the tissues below the epithelium, the condition is called a corneal ulcer. CAUSES   Scratches.  Trauma.  Foreign body in the eye. Some people have recurrences of abrasions in the area of the original injury even after it has healed (recurrent erosion syndrome). Recurrent erosion syndrome generally improves and goes away with time. SYMPTOMS   Eye pain.  Difficulty or inability to keep the injured eye open.  The eye becomes very sensitive to light.  Recurrent erosions tend to happen suddenly, first thing in the morning, usually after waking up and opening the eye. DIAGNOSIS  Your health care provider can diagnose a corneal abrasion during an eye exam. Dye is usually placed in the eye using a drop or a small paper strip moistened by your tears. When the eye is examined with a special light, the abrasion shows up clearly because of the dye. TREATMENT   Small abrasions may be treated with antibiotic drops or ointment alone.  A pressure patch may be put over the eye. If this is done, follow your doctor's instructions for when to remove the patch. Do not drive or use machines while the eye patch is on. Judging distances is hard to do with a patch on. If the abrasion becomes infected and spreads to the deeper tissues of the cornea, a corneal ulcer can result. This is serious because it can cause corneal scarring. Corneal scars interfere with light passing through the cornea and cause a loss of vision in the involved eye. HOME CARE  INSTRUCTIONS  Use medicine or ointment as directed. Only take over-the-counter or prescription medicines for pain, discomfort, or fever as directed by your health care provider.  Do not drive or operate machinery if your eye is patched. Your ability to judge distances is impaired.  If your health care provider has given you a follow-up appointment, it is very important to keep that appointment. Not keeping the appointment could result in a severe eye infection or permanent loss of vision. If there is any problem keeping the appointment, let your health care provider know. SEEK MEDICAL CARE IF:   You have pain, light sensitivity, and a scratchy feeling in one eye or both eyes.  Your pressure patch keeps loosening up, and you can blink your eye under the patch after treatment.  Any kind of discharge develops from the eye after treatment or if the lids stick together in the morning.  You have the same symptoms in the morning as you did with the original abrasion days, weeks, or months after the abrasion healed.   This information is not intended to replace advice given to you by your health care provider. Make sure you discuss any questions you have with your health care provider.   Document Released: 04/12/2000 Document Revised: 01/04/2015 Document Reviewed: 12/21/2012 Elsevier Interactive Patient Education 2016 [employer]  Use the eye antibiotic as directed and the eye anti-inflammatory as needed. You may also use OTC artificial tears as needed. Follow-up with [facility] if needed.

## 2015-10-16 NOTE — ED Notes (Signed)
Pt presents with left eye pain after a box fell on him at work this am. Wants to make sure he did not tear his eye. Pt does not want to file workmans comp.

## 2015-12-22 ENCOUNTER — Emergency Department (HOSPITAL_COMMUNITY)
Admission: EM | Admit: 2015-12-22 | Discharge: 2015-12-22 | Disposition: A | Discharge status: Home / Self Care | Payer: PRIVATE HEALTH INSURANCE | Attending: Emergency Medicine | Admitting: Emergency Medicine

## 2015-12-22 ENCOUNTER — Encounter (HOSPITAL_COMMUNITY): Payer: Self-pay | Admitting: Emergency Medicine

## 2015-12-22 DIAGNOSIS — J45909 Unspecified asthma, uncomplicated: Secondary | ICD-10-CM | POA: Insufficient documentation

## 2015-12-22 DIAGNOSIS — Z79899 Other long term (current) drug therapy: Secondary | ICD-10-CM | POA: Insufficient documentation

## 2015-12-22 DIAGNOSIS — R197 Diarrhea, unspecified: Secondary | ICD-10-CM | POA: Insufficient documentation

## 2015-12-22 DIAGNOSIS — R112 Nausea with vomiting, unspecified: Secondary | ICD-10-CM | POA: Insufficient documentation

## 2015-12-22 LAB — CBC WITH DIFFERENTIAL/PLATELET
BASOS ABS: 0 10*3/uL (ref 0.0–0.1)
Basophils Relative: 0 %
EOS ABS: 0 10*3/uL (ref 0.0–0.7)
EOS PCT: 0 %
HCT: 43.2 % (ref 39.0–52.0)
Hemoglobin: 14.8 g/dL (ref 13.0–17.0)
Lymphocytes Relative: 12 %
Lymphs Abs: 1.5 10*3/uL (ref 0.7–4.0)
MCH: 29.2 pg (ref 26.0–34.0)
MCHC: 34.3 g/dL (ref 30.0–36.0)
MCV: 85.2 fL (ref 78.0–100.0)
Monocytes Absolute: 0.5 10*3/uL (ref 0.1–1.0)
Monocytes Relative: 4 %
Neutro Abs: 10.3 10*3/uL — ABNORMAL HIGH (ref 1.7–7.7)
Neutrophils Relative %: 84 %
PLATELETS: 251 10*3/uL (ref 150–400)
RBC: 5.07 MIL/uL (ref 4.22–5.81)
RDW: 12.8 % (ref 11.5–15.5)
WBC: 12.2 10*3/uL — AB (ref 4.0–10.5)

## 2015-12-22 LAB — COMPREHENSIVE METABOLIC PANEL
ALT: 14 U/L — ABNORMAL LOW (ref 17–63)
ANION GAP: 9 (ref 5–15)
AST: 18 U/L (ref 15–41)
Albumin: 4.1 g/dL (ref 3.5–5.0)
Alkaline Phosphatase: 72 U/L (ref 38–126)
BUN: 8 mg/dL (ref 6–20)
CO2: 27 mmol/L (ref 22–32)
Calcium: 9.2 mg/dL (ref 8.9–10.3)
Chloride: 102 mmol/L (ref 101–111)
Creatinine, Ser: 1.26 mg/dL — ABNORMAL HIGH (ref 0.61–1.24)
GFR calc non Af Amer: >60 mL/min (ref >60)
Glucose, Bld: 117 mg/dL — ABNORMAL HIGH (ref 65–99)
Potassium: 3.4 mmol/L — ABNORMAL LOW (ref 3.5–5.1)
SODIUM: 138 mmol/L (ref 135–145)
Total Bilirubin: 0.7 mg/dL (ref 0.3–1.2)
Total Protein: 7 g/dL (ref 6.5–8.1)

## 2015-12-22 LAB — LIPASE, BLOOD: Lipase: 33 U/L (ref 11–51)

## 2015-12-22 MED ORDER — ONDANSETRON HCL 4 MG/2ML IJ SOLN
4.0000 mg | Freq: Once | INTRAMUSCULAR | Status: AC
  Administered 2015-12-22: 4 mg via INTRAVENOUS
  Filled 2015-12-22: qty 2

## 2015-12-22 MED ORDER — ONDANSETRON 4 MG PO TBDP: 4.0000 mg | ORAL_TABLET | Freq: Three times a day (TID) | ORAL | 0 refills | Status: AC | PRN

## 2015-12-22 MED ORDER — SODIUM CHLORIDE 0.9 % IV BOLUS (SEPSIS)
1000.0000 mL | Freq: Once | INTRAVENOUS | Status: AC
  Administered 2015-12-22: 1000 mL via INTRAVENOUS

## 2015-12-22 NOTE — Discharge Instructions (Signed)
Take the prescribed medication as directed for nausea. Continue drinking fluids to stay hydrated. Recommend to start with gentle diet and progress back to normal as tolerated. Follow-up with  Return to the ED for new or worsening symptoms.

## 2015-12-22 NOTE — ED Notes (Signed)
Pt able to drink fluids and keep them down 

## 2015-12-22 NOTE — ED Provider Notes (Signed)
[facility]-EMERGENCY DEPT Provider Note   CSN: [id number] Arrival date & time: 12/22/15  4098     History   Chief Complaint Chief Complaint  Patient presents with  . Abdominal Pain  . Nausea  . Emesis    HPI Cristian Castillo is a 30 y.o. male.  The history is provided by the patient and medical records.    30 y.o.  M with hx of asthma and heart murmur, presenting to the ED for Nausea, vomiting, diarrhea, onset 1 AM this morning. Patient states he ate Seafood dinner last night and went to bed as normal. He states his food tasted fine, girlfriend ate the same pain and is not currently having any symptoms. He states he has had 3 episodes of nonbloody, nonbilious emesis since 1 AM. He also reports some watery diarrhea. He denies any fever, does endorse some chills and sweats with episodes of vomiting. No history of abdominal surgeries. No allergies. No medications tried prior to arrival.  Past Medical History:  Diagnosis Date  . Asthma   . Heart murmur     Patient Active Problem List   Diagnosis Date Noted  . Laceration of heel without complication 07/21/2014    Past Surgical History:  Procedure Laterality Date  . I&D EXTREMITY Right 07/21/2014   Procedure: IRRIGATION AND DEBRIDEMENT SOFT TISSUE AVULSION RIGHT HEEL;  Surgeon: [name], MD;  Location: [facility] OR;  Service: Orthopedics;  Laterality: Right;  . ROTATOR CUFF REPAIR         Home Medications    Prior to Admission medications   Medication Sig Start Date End Date Taking? Authorizing Provider  amoxicillin-clavulanate (AUGMENTIN) 875-125 MG per tablet Take 1 tablet by mouth 2 (two) times daily. 07/22/14   [name], MD  docusate sodium (COLACE) 100 MG capsule Take 1 capsule (100 mg total) by mouth 2 (two) times daily. 07/22/14   [name], MD  HYDROcodone-acetaminophen (NORCO/VICODIN) 5-325 MG per tablet Take 1-2 tablets by mouth every 4 (four) hours as needed (breakthrough pain). 07/22/14   [name], MD    ibuprofen (ADVIL,MOTRIN) 200 MG tablet Take 800 mg by mouth every 6 (six) hours as needed for headache or moderate pain.     Historical Provider, MD  ketorolac (ACULAR) 0.5 % ophthalmic solution Place 1 drop into the left eye 4 (four) times daily. 10/16/15   Jenise V Bacon Menshew, PA-C  PRESCRIPTION MEDICATION Take 1 tablet by mouth daily as needed (muscle relaxer).    Historical Provider, MD    Family History History reviewed. No pertinent family history.  Social History Social History  Substance Use Topics  . Smoking status: Never Smoker  . Smokeless tobacco: Never Used  . Alcohol use Yes     Comment: socially     Allergies   Review of patient's allergies indicates no known allergies.   Review of Systems Review of Systems  Gastrointestinal: Positive for abdominal pain, diarrhea, nausea and vomiting.  All other systems reviewed and are negative.    Physical Exam Updated Vital Signs BP 136/87   Pulse (!) 58   Temp 98 F (36.7 C) (Oral)   Resp 18   Ht 5\' 11"  (1.803 m)   Wt 88.5 kg   SpO2 99%   BMI 27.20 kg/m   Physical Exam  Constitutional: He is oriented to person, place, and time. He appears well-developed and well-nourished.  HENT:  Head: Normocephalic and atraumatic.  Mouth/Throat: Oropharynx is clear and moist.  Mildly dry mucous membranes  Eyes:  Conjunctivae and EOM are normal. Pupils are equal, round, and reactive to light.  Neck: Normal range of motion.  Cardiovascular: Normal rate, regular rhythm and normal heart sounds.   Pulmonary/Chest: Effort normal and breath sounds normal.  Abdominal: Soft. Bowel sounds are normal. There is no tenderness. There is no rigidity and no guarding.  Abdomen soft, non-distended, no focal tenderness, no peritonitis  Musculoskeletal: Normal range of motion.  Neurological: He is alert and oriented to person, place, and time.  Skin: Skin is warm and dry.  Psychiatric: He has a normal mood and affect.  Nursing note and  vitals reviewed.    ED Treatments / Results  Labs (all labs ordered are listed, but only abnormal results are displayed) Labs Reviewed  CBC WITH DIFFERENTIAL/PLATELET - Abnormal; Notable for the following:       Result Value   WBC 12.2 (*)    Neutro Abs 10.3 (*)    All other components within normal limits  COMPREHENSIVE METABOLIC PANEL - Abnormal; Notable for the following:    Potassium 3.4 (*)    Glucose, Bld 117 (*)    Creatinine, Ser 1.26 (*)    ALT 14 (*)    All other components within normal limits  LIPASE, BLOOD    EKG  EKG Interpretation None       Radiology No results found.  Procedures Procedures (including critical care time)  Medications Ordered in ED Medications  sodium chloride 0.9 % bolus 1,000 mL (not administered)  ondansetron (ZOFRAN) injection 4 mg (not administered)     Initial Impression / Assessment and Plan / ED Course  I have reviewed the triage vital signs and the nursing notes.  Pertinent labs & imaging results that were available during my care of the patient were reviewed by me and considered in my medical decision making (see chart for details).  Clinical Course   30 year old male here with nausea vomiting and diarrhea onset this morning at 1 AM. Reports he ate [employer] last night. He is afebrile and nontoxic. Abdomen is soft and benign. Mucous numbers are mildly dry. Patient given IV fluids and Zofran with improvement of his symptoms. His lab work is reassuring, he does have a slight leukocytosis which may be reactive from vomiting. States he is feeling better after treatment. He has not had any active vomiting here. Tolerating Sprite without difficulty. Abdomen remains soft and benign.  Symptoms likely due to viral process.  Will discharge home with supportive care.  Recommended continue oral fluids, gentle diet and progress back to normal as tolerated.  Discussed plan with patient, he acknowledged understanding and agreed with plan of care.   Return precautions given for new or worsening symptoms.  Final Clinical Impressions(s) / ED Diagnoses   Final diagnoses:  Nausea vomiting and diarrhea    New Prescriptions Discharge Medication List as of 12/22/2015 11:14 AM    START taking these medications   Details  ondansetron (ZOFRAN ODT) 4 MG disintegrating tablet Take 1 tablet (4 mg total) by mouth every 8 (eight) hours as needed for nausea., Starting Fri 12/22/2015, Print         Garlon HatchetLisa M Verenis [place], PA-C 12/22/15 1136    Doug SouSam Jacubowitz, MD 12/22/15 1530

## 2015-12-22 NOTE — ED Triage Notes (Signed)
Pt here this morning with c/o n/v/d that started this morning around 1 am .

## 2015-12-22 NOTE — ED Notes (Signed)
Provided pt with Sprite to drink per [place], [employer] fundraiser

## 2016-07-28 ENCOUNTER — Emergency Department
Admission: EM | Admit: 2016-07-28 | Discharge: 2016-07-28 | Disposition: A | Discharge status: Home / Self Care | Payer: Self-pay | Attending: Emergency Medicine | Admitting: Emergency Medicine

## 2016-07-28 ENCOUNTER — Encounter: Payer: Self-pay | Admitting: Emergency Medicine

## 2016-07-28 ENCOUNTER — Emergency Department: Payer: Self-pay

## 2016-07-28 DIAGNOSIS — Y9289 Other specified places as the place of occurrence of the external cause: Secondary | ICD-10-CM | POA: Insufficient documentation

## 2016-07-28 DIAGNOSIS — Y999 Unspecified external cause status: Secondary | ICD-10-CM | POA: Insufficient documentation

## 2016-07-28 DIAGNOSIS — Z791 Long term (current) use of non-steroidal anti-inflammatories (NSAID): Secondary | ICD-10-CM | POA: Insufficient documentation

## 2016-07-28 DIAGNOSIS — J45909 Unspecified asthma, uncomplicated: Secondary | ICD-10-CM | POA: Insufficient documentation

## 2016-07-28 DIAGNOSIS — Y939 Activity, unspecified: Secondary | ICD-10-CM | POA: Insufficient documentation

## 2016-07-28 DIAGNOSIS — Z79899 Other long term (current) drug therapy: Secondary | ICD-10-CM | POA: Insufficient documentation

## 2016-07-28 DIAGNOSIS — S025XXA Fracture of tooth (traumatic), initial encounter for closed fracture: Secondary | ICD-10-CM | POA: Insufficient documentation

## 2016-07-28 MED ORDER — IBUPROFEN 600 MG PO TABS
600.0000 mg | ORAL_TABLET | Freq: Once | ORAL | Status: AC
  Administered 2016-07-28: 600 mg via ORAL
  Filled 2016-07-28: qty 1

## 2016-07-28 MED ORDER — TRAMADOL HCL 50 MG PO TABS: 50.0000 mg | ORAL_TABLET | Freq: Four times a day (QID) | ORAL | 0 refills | Status: AC | PRN

## 2016-07-28 MED ORDER — OXYCODONE-ACETAMINOPHEN 5-325 MG PO TABS
1.0000 | ORAL_TABLET | Freq: Once | ORAL | Status: AC
  Administered 2016-07-28: 1 via ORAL
  Filled 2016-07-28: qty 1

## 2016-07-28 MED ORDER — IBUPROFEN 600 MG PO TABS: 600.0000 mg | ORAL_TABLET | Freq: Three times a day (TID) | ORAL | 0 refills | Status: AC | PRN
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
